# Patient Record
Sex: Female | Born: 1940 | Race: White | Hispanic: No | Marital: Married
Health system: Southern US, Community
[De-identification: ages and names within clinical notes are randomized; demographics above are authoritative.]

---

## 1997-07-26 ENCOUNTER — Ambulatory Visit (HOSPITAL_BASED_OUTPATIENT_CLINIC_OR_DEPARTMENT_OTHER): Admission: RE | Admit: 1997-07-26 | Discharge: 1997-07-26 | Payer: Self-pay | Admitting: Plastic Surgery

## 1997-10-31 ENCOUNTER — Other Ambulatory Visit: Admission: RE | Admit: 1997-10-31 | Discharge: 1997-10-31 | Payer: Self-pay | Admitting: *Deleted

## 1997-11-01 ENCOUNTER — Other Ambulatory Visit: Admission: RE | Admit: 1997-11-01 | Discharge: 1997-11-01 | Payer: Self-pay | Admitting: *Deleted

## 1998-11-03 ENCOUNTER — Other Ambulatory Visit: Admission: RE | Admit: 1998-11-03 | Discharge: 1998-11-03 | Payer: Self-pay | Admitting: *Deleted

## 1999-05-04 ENCOUNTER — Encounter: Admission: RE | Admit: 1999-05-04 | Discharge: 1999-05-04 | Payer: Self-pay | Admitting: Geriatric Medicine

## 1999-05-04 ENCOUNTER — Encounter: Payer: Self-pay | Admitting: Geriatric Medicine

## 1999-09-28 ENCOUNTER — Encounter: Admission: RE | Admit: 1999-09-28 | Discharge: 1999-09-28 | Payer: Self-pay | Admitting: Geriatric Medicine

## 1999-09-28 ENCOUNTER — Encounter: Payer: Self-pay | Admitting: Geriatric Medicine

## 1999-11-20 ENCOUNTER — Other Ambulatory Visit: Admission: RE | Admit: 1999-11-20 | Discharge: 1999-11-20 | Payer: Self-pay | Admitting: *Deleted

## 2000-09-29 ENCOUNTER — Encounter: Admission: RE | Admit: 2000-09-29 | Discharge: 2000-09-29 | Payer: Self-pay | Admitting: *Deleted

## 2000-09-29 ENCOUNTER — Encounter: Payer: Self-pay | Admitting: *Deleted

## 2000-11-25 ENCOUNTER — Other Ambulatory Visit: Admission: RE | Admit: 2000-11-25 | Discharge: 2000-11-25 | Payer: Self-pay | Admitting: *Deleted

## 2000-12-15 ENCOUNTER — Encounter: Admission: RE | Admit: 2000-12-15 | Discharge: 2000-12-15 | Payer: Self-pay | Admitting: *Deleted

## 2000-12-15 ENCOUNTER — Encounter: Payer: Self-pay | Admitting: *Deleted

## 2001-09-29 ENCOUNTER — Encounter: Payer: Self-pay | Admitting: Geriatric Medicine

## 2001-09-29 ENCOUNTER — Encounter: Admission: RE | Admit: 2001-09-29 | Discharge: 2001-09-29 | Payer: Self-pay | Admitting: Geriatric Medicine

## 2001-11-09 ENCOUNTER — Other Ambulatory Visit: Admission: RE | Admit: 2001-11-09 | Discharge: 2001-11-09 | Payer: Self-pay | Admitting: Obstetrics and Gynecology

## 2002-10-01 ENCOUNTER — Encounter: Admission: RE | Admit: 2002-10-01 | Discharge: 2002-10-01 | Payer: Self-pay | Admitting: Obstetrics and Gynecology

## 2002-10-01 ENCOUNTER — Encounter: Payer: Self-pay | Admitting: Obstetrics and Gynecology

## 2002-11-15 ENCOUNTER — Other Ambulatory Visit: Admission: RE | Admit: 2002-11-15 | Discharge: 2002-11-15 | Payer: Self-pay | Admitting: Obstetrics and Gynecology

## 2003-02-16 ENCOUNTER — Ambulatory Visit (HOSPITAL_COMMUNITY): Admission: RE | Admit: 2003-02-16 | Discharge: 2003-02-16 | Payer: Self-pay | Admitting: Gastroenterology

## 2003-02-16 ENCOUNTER — Encounter (INDEPENDENT_AMBULATORY_CARE_PROVIDER_SITE_OTHER): Payer: Self-pay | Admitting: Specialist

## 2003-10-03 ENCOUNTER — Encounter: Admission: RE | Admit: 2003-10-03 | Discharge: 2003-10-03 | Payer: Self-pay | Admitting: Geriatric Medicine

## 2003-12-12 ENCOUNTER — Encounter: Admission: RE | Admit: 2003-12-12 | Discharge: 2003-12-12 | Payer: Self-pay | Admitting: Obstetrics and Gynecology

## 2004-10-03 ENCOUNTER — Encounter: Admission: RE | Admit: 2004-10-03 | Discharge: 2004-10-03 | Payer: Self-pay | Admitting: Obstetrics and Gynecology

## 2005-10-07 ENCOUNTER — Encounter: Admission: RE | Admit: 2005-10-07 | Discharge: 2005-10-07 | Payer: Self-pay | Admitting: Geriatric Medicine

## 2006-10-16 ENCOUNTER — Encounter: Admission: RE | Admit: 2006-10-16 | Discharge: 2006-10-16 | Payer: Self-pay | Admitting: Obstetrics and Gynecology

## 2007-10-19 ENCOUNTER — Encounter: Admission: RE | Admit: 2007-10-19 | Discharge: 2007-10-19 | Payer: Self-pay | Admitting: Geriatric Medicine

## 2008-10-19 ENCOUNTER — Encounter: Admission: RE | Admit: 2008-10-19 | Discharge: 2008-10-19 | Payer: Self-pay | Admitting: Geriatric Medicine

## 2009-10-20 ENCOUNTER — Encounter: Admission: RE | Admit: 2009-10-20 | Discharge: 2009-10-20 | Payer: Self-pay | Admitting: Geriatric Medicine

## 2010-06-25 ENCOUNTER — Other Ambulatory Visit: Payer: Self-pay | Admitting: Dermatology

## 2010-07-20 NOTE — Op Note (Signed)
Judith Willis, Judith Willis                          ACCOUNT NO.:  [id number]   MEDICAL RECORD NO.:  [id number]                   PATIENT TYPE:  AMB   LOCATION:  ENDO                                 FACILITY:  [facility]   PHYSICIAN:  Danise Edge, M.D.                DATE OF BIRTH:  29-Oct-1940   DATE OF PROCEDURE:  02/16/2003  DATE OF DISCHARGE:                                 OPERATIVE REPORT   PROCEDURE:  Screening colonoscopy.   INDICATIONS FOR PROCEDURE:  [name] is a 70 year old female born  1940/10/26.  [name] is scheduled to undergo her first screening  colonoscopy with polypectomy to prevent colon cancer.  She has new onset  constipation.   ENDOSCOPIST:  Danise Edge, M.D.   PREMEDICATION:  Versed 5 mg, Demerol 50 mg.   DESCRIPTION OF PROCEDURE:  After obtaining informed consent, [name] was  placed in the left lateral decubitus position. I administered intravenous  Demerol and intravenous Versed to achieve conscious sedation for the  procedure. The patient's blood pressure, oxygen saturation and cardiac  rhythm were monitored throughout the procedure and documented in the medical  record.   Anal inspection was normal. Digital rectal exam was normal. The Olympus  adjustable pediatric colonoscope was introduced into the rectum and advanced  to the cecum. Colonic preparation for the exam today was excellent.   RECTUM:  From the mid rectum, two diminutive polyps were removed with the  cold biopsy forceps and submitted for pathological interpretation.   SIGMOID COLON AND DESCENDING COLON:  Normal.   SPLENIC FLEXURE:  Normal.   TRANSVERSE COLON:  Normal.   HEPATIC FLEXURE:  Normal.   ASCENDING COLON:  Normal.   CECUM AND ILEOCECAL VALVE:  Normal.   ASSESSMENT:  Two diminutive rectal polyps were removed;  otherwise normal  proctocolonoscopy to the cecum.   RECOMMENDATIONS:  Repeat colonoscopy in five years if rectal polyps return  neoplastic  pathologically.                                               Danise Edge, M.D.    MJ/MEDQ  D:  02/16/2003  T:  02/16/2003  Job:  [id number]   cc:   Hal T. Stoneking, M.D.  [address]  [place], [place] [address]  Fax: [phone]   Cordelia Pen A. Rosalio Macadamia, M.D.  [address]  [address]  [place]  [place] [address]  Fax: [phone]

## 2010-08-28 ENCOUNTER — Other Ambulatory Visit: Payer: Self-pay | Admitting: Obstetrics

## 2010-08-28 DIAGNOSIS — Z1231 Encounter for screening mammogram for malignant neoplasm of breast: Secondary | ICD-10-CM

## 2010-10-23 ENCOUNTER — Ambulatory Visit
Admission: RE | Admit: 2010-10-23 | Discharge: 2010-10-23 | Disposition: A | Discharge status: Home / Self Care | Payer: Medicare Other | Source: Ambulatory Visit | Attending: Obstetrics | Admitting: Obstetrics

## 2010-10-23 DIAGNOSIS — Z1231 Encounter for screening mammogram for malignant neoplasm of breast: Secondary | ICD-10-CM

## 2011-09-18 ENCOUNTER — Other Ambulatory Visit: Payer: Self-pay | Admitting: Geriatric Medicine

## 2011-09-18 DIAGNOSIS — Z1231 Encounter for screening mammogram for malignant neoplasm of breast: Secondary | ICD-10-CM

## 2011-11-01 ENCOUNTER — Ambulatory Visit
Admission: RE | Admit: 2011-11-01 | Discharge: 2011-11-01 | Disposition: A | Discharge status: Home / Self Care | Payer: Medicare Other | Source: Ambulatory Visit | Attending: Geriatric Medicine | Admitting: Geriatric Medicine

## 2011-11-01 DIAGNOSIS — Z1231 Encounter for screening mammogram for malignant neoplasm of breast: Secondary | ICD-10-CM

## 2012-03-12 ENCOUNTER — Other Ambulatory Visit: Payer: Self-pay | Admitting: Obstetrics

## 2012-03-12 DIAGNOSIS — M899 Disorder of bone, unspecified: Secondary | ICD-10-CM

## 2012-03-30 ENCOUNTER — Other Ambulatory Visit: Payer: Self-pay | Admitting: Obstetrics

## 2012-03-30 ENCOUNTER — Ambulatory Visit
Admission: RE | Admit: 2012-03-30 | Discharge: 2012-03-30 | Disposition: A | Discharge status: Home / Self Care | Payer: Medicare Other | Source: Ambulatory Visit | Attending: Obstetrics | Admitting: Obstetrics

## 2012-03-30 DIAGNOSIS — M899 Disorder of bone, unspecified: Secondary | ICD-10-CM

## 2012-03-30 DIAGNOSIS — M949 Disorder of cartilage, unspecified: Secondary | ICD-10-CM

## 2012-05-04 ENCOUNTER — Ambulatory Visit
Admission: RE | Admit: 2012-05-04 | Discharge: 2012-05-04 | Disposition: A | Discharge status: Home / Self Care | Payer: Medicare Other | Source: Ambulatory Visit | Attending: Obstetrics | Admitting: Obstetrics

## 2012-05-04 DIAGNOSIS — M899 Disorder of bone, unspecified: Secondary | ICD-10-CM

## 2012-07-29 ENCOUNTER — Other Ambulatory Visit: Payer: Self-pay | Admitting: Dermatology

## 2012-10-07 ENCOUNTER — Other Ambulatory Visit: Payer: Self-pay

## 2012-10-07 DIAGNOSIS — Z1231 Encounter for screening mammogram for malignant neoplasm of breast: Secondary | ICD-10-CM

## 2012-11-03 ENCOUNTER — Ambulatory Visit
Admission: RE | Admit: 2012-11-03 | Discharge: 2012-11-03 | Disposition: A | Discharge status: Home / Self Care | Payer: Medicare Other | Source: Ambulatory Visit

## 2012-11-03 DIAGNOSIS — Z1231 Encounter for screening mammogram for malignant neoplasm of breast: Secondary | ICD-10-CM

## 2013-03-03 ENCOUNTER — Other Ambulatory Visit: Payer: Self-pay | Admitting: Dermatology

## 2013-08-02 ENCOUNTER — Other Ambulatory Visit: Payer: Self-pay | Admitting: Dermatology

## 2013-10-06 ENCOUNTER — Other Ambulatory Visit: Payer: Self-pay

## 2013-10-06 DIAGNOSIS — Z1231 Encounter for screening mammogram for malignant neoplasm of breast: Secondary | ICD-10-CM

## 2013-11-05 ENCOUNTER — Ambulatory Visit
Admission: RE | Admit: 2013-11-05 | Discharge: 2013-11-05 | Disposition: A | Discharge status: Home / Self Care | Payer: Medicare Other | Source: Ambulatory Visit

## 2013-11-05 DIAGNOSIS — Z1231 Encounter for screening mammogram for malignant neoplasm of breast: Secondary | ICD-10-CM

## 2014-01-13 ENCOUNTER — Other Ambulatory Visit: Payer: Self-pay | Admitting: Dermatology

## 2014-03-02 ENCOUNTER — Other Ambulatory Visit: Payer: Self-pay | Admitting: Dermatology

## 2014-05-04 ENCOUNTER — Other Ambulatory Visit: Payer: Self-pay | Admitting: Dermatology

## 2014-10-07 ENCOUNTER — Other Ambulatory Visit: Payer: Self-pay

## 2014-10-07 DIAGNOSIS — Z1231 Encounter for screening mammogram for malignant neoplasm of breast: Secondary | ICD-10-CM

## 2014-11-15 ENCOUNTER — Ambulatory Visit
Admission: RE | Admit: 2014-11-15 | Discharge: 2014-11-15 | Disposition: A | Discharge status: Home / Self Care | Payer: Medicare Other | Source: Ambulatory Visit

## 2014-11-15 DIAGNOSIS — Z1231 Encounter for screening mammogram for malignant neoplasm of breast: Secondary | ICD-10-CM

## 2015-04-17 ENCOUNTER — Other Ambulatory Visit: Payer: Self-pay | Admitting: Obstetrics

## 2015-04-17 DIAGNOSIS — E2839 Other primary ovarian failure: Secondary | ICD-10-CM

## 2015-04-17 DIAGNOSIS — M949 Disorder of cartilage, unspecified: Principal | ICD-10-CM

## 2015-04-17 DIAGNOSIS — M899 Disorder of bone, unspecified: Secondary | ICD-10-CM

## 2015-04-28 ENCOUNTER — Ambulatory Visit
Admission: RE | Admit: 2015-04-28 | Discharge: 2015-04-28 | Disposition: A | Discharge status: Home / Self Care | Payer: Medicare Other | Source: Ambulatory Visit | Attending: Obstetrics | Admitting: Obstetrics

## 2015-04-28 DIAGNOSIS — M949 Disorder of cartilage, unspecified: Principal | ICD-10-CM

## 2015-04-28 DIAGNOSIS — E2839 Other primary ovarian failure: Secondary | ICD-10-CM

## 2015-04-28 DIAGNOSIS — M899 Disorder of bone, unspecified: Secondary | ICD-10-CM

## 2015-10-09 ENCOUNTER — Other Ambulatory Visit: Payer: Self-pay | Admitting: Geriatric Medicine

## 2015-10-09 DIAGNOSIS — Z1231 Encounter for screening mammogram for malignant neoplasm of breast: Secondary | ICD-10-CM

## 2015-11-20 ENCOUNTER — Ambulatory Visit
Admission: RE | Admit: 2015-11-20 | Discharge: 2015-11-20 | Disposition: A | Discharge status: Home / Self Care | Payer: Medicare Other | Source: Ambulatory Visit | Attending: Geriatric Medicine | Admitting: Geriatric Medicine

## 2015-11-20 DIAGNOSIS — Z1231 Encounter for screening mammogram for malignant neoplasm of breast: Secondary | ICD-10-CM

## 2016-10-11 ENCOUNTER — Other Ambulatory Visit: Payer: Self-pay | Admitting: Geriatric Medicine

## 2016-10-11 DIAGNOSIS — Z1231 Encounter for screening mammogram for malignant neoplasm of breast: Secondary | ICD-10-CM

## 2016-11-25 ENCOUNTER — Ambulatory Visit
Admission: RE | Admit: 2016-11-25 | Discharge: 2016-11-25 | Disposition: A | Discharge status: Home / Self Care | Payer: Medicare Other | Source: Ambulatory Visit | Attending: Geriatric Medicine | Admitting: Geriatric Medicine

## 2016-11-25 DIAGNOSIS — Z1231 Encounter for screening mammogram for malignant neoplasm of breast: Secondary | ICD-10-CM

## 2017-10-20 ENCOUNTER — Other Ambulatory Visit: Payer: Self-pay | Admitting: Geriatric Medicine

## 2017-10-20 DIAGNOSIS — Z1231 Encounter for screening mammogram for malignant neoplasm of breast: Secondary | ICD-10-CM

## 2017-11-28 ENCOUNTER — Ambulatory Visit
Admission: RE | Admit: 2017-11-28 | Discharge: 2017-11-28 | Disposition: A | Discharge status: Home / Self Care | Payer: Medicare Other | Source: Ambulatory Visit | Attending: Geriatric Medicine | Admitting: Geriatric Medicine

## 2017-11-28 DIAGNOSIS — Z1231 Encounter for screening mammogram for malignant neoplasm of breast: Secondary | ICD-10-CM

## 2018-10-21 ENCOUNTER — Other Ambulatory Visit: Payer: Self-pay | Admitting: Geriatric Medicine

## 2018-10-21 DIAGNOSIS — Z1231 Encounter for screening mammogram for malignant neoplasm of breast: Secondary | ICD-10-CM

## 2018-11-18 ENCOUNTER — Other Ambulatory Visit: Payer: Self-pay | Admitting: Geriatric Medicine

## 2018-11-18 DIAGNOSIS — M8580 Other specified disorders of bone density and structure, unspecified site: Secondary | ICD-10-CM

## 2018-12-04 ENCOUNTER — Other Ambulatory Visit: Payer: Self-pay

## 2018-12-04 ENCOUNTER — Ambulatory Visit
Admission: RE | Admit: 2018-12-04 | Discharge: 2018-12-04 | Disposition: A | Discharge status: Home / Self Care | Payer: Medicare Other | Source: Ambulatory Visit | Attending: Geriatric Medicine | Admitting: Geriatric Medicine

## 2018-12-04 DIAGNOSIS — Z1231 Encounter for screening mammogram for malignant neoplasm of breast: Secondary | ICD-10-CM

## 2019-01-13 ENCOUNTER — Other Ambulatory Visit: Payer: Medicare Other

## 2019-03-16 ENCOUNTER — Other Ambulatory Visit: Payer: Medicare Other

## 2019-03-22 ENCOUNTER — Ambulatory Visit: Payer: Medicare Other

## 2019-03-23 ENCOUNTER — Ambulatory Visit: Payer: Medicare PPO | Attending: Internal Medicine

## 2019-03-23 DIAGNOSIS — Z23 Encounter for immunization: Secondary | ICD-10-CM | POA: Insufficient documentation

## 2019-03-23 NOTE — Progress Notes (Signed)
   Covid-19 Vaccination Clinic  Name:  SHAKILA MAK    MRN: [id number] DOB: Mar 29, 1940  03/23/2019  Ms. Beil was observed post Covid-19 immunization for 15 minutes without incidence. She was provided with Vaccine Information Sheet and instruction to access the V-Safe system.   Ms. Balash was instructed to call 911 with any severe reactions post vaccine: Marland Kitchen Difficulty breathing  . Swelling of your face and throat  . A fast heartbeat  . A bad rash all over your body  . Dizziness and weakness    Immunizations Administered    Name Date Dose VIS Date Route   Pfizer COVID-19 Vaccine 03/23/2019  1:54 PM 0.3 mL 02/12/2019 Intramuscular   Manufacturer: [employer], [employer]   Lot: [id number]   NDC: [address]-4

## 2019-04-13 ENCOUNTER — Ambulatory Visit: Payer: Medicare PPO

## 2019-04-13 ENCOUNTER — Ambulatory Visit: Payer: Medicare PPO | Attending: Internal Medicine

## 2019-04-13 DIAGNOSIS — Z23 Encounter for immunization: Secondary | ICD-10-CM | POA: Insufficient documentation

## 2019-04-13 NOTE — Progress Notes (Signed)
   Covid-19 Vaccination Clinic  Name:  Judith Willis    MRN: [id number] DOB: 1940/11/30  04/13/2019  [name] was observed post Covid-19 immunization for 15 minutes without incidence. She was provided with Vaccine Information Sheet and instruction to access the V-Safe system.   [name] was instructed to call 911 with any severe reactions post vaccine: Marland Kitchen Difficulty breathing  . Swelling of your face and throat  . A fast heartbeat  . A bad rash all over your body  . Dizziness and weakness    Immunizations Administered    Name Date Dose VIS Date Route   Pfizer COVID-19 Vaccine 04/13/2019  8:24 AM 0.3 mL 02/12/2019 Intramuscular   Manufacturer: [employer], [employer]   Lot: [id number]   NDC: [address]-2

## 2019-04-15 ENCOUNTER — Other Ambulatory Visit: Payer: Medicare Other

## 2019-05-12 ENCOUNTER — Other Ambulatory Visit: Payer: Self-pay

## 2019-05-12 ENCOUNTER — Ambulatory Visit
Admission: RE | Admit: 2019-05-12 | Discharge: 2019-05-12 | Disposition: A | Discharge status: Home / Self Care | Payer: Medicare PPO | Source: Ambulatory Visit | Attending: Geriatric Medicine | Admitting: Geriatric Medicine

## 2019-05-12 DIAGNOSIS — M8580 Other specified disorders of bone density and structure, unspecified site: Secondary | ICD-10-CM

## 2019-07-07 DIAGNOSIS — H353211 Exudative age-related macular degeneration, right eye, with active choroidal neovascularization: Secondary | ICD-10-CM | POA: Diagnosis not present

## 2019-07-07 DIAGNOSIS — Z961 Presence of intraocular lens: Secondary | ICD-10-CM | POA: Diagnosis not present

## 2019-07-07 DIAGNOSIS — H353122 Nonexudative age-related macular degeneration, left eye, intermediate dry stage: Secondary | ICD-10-CM | POA: Diagnosis not present

## 2019-07-07 DIAGNOSIS — H40003 Preglaucoma, unspecified, bilateral: Secondary | ICD-10-CM | POA: Diagnosis not present

## 2019-08-04 DIAGNOSIS — D485 Neoplasm of uncertain behavior of skin: Secondary | ICD-10-CM | POA: Diagnosis not present

## 2019-08-04 DIAGNOSIS — D0472 Carcinoma in situ of skin of left lower limb, including hip: Secondary | ICD-10-CM | POA: Diagnosis not present

## 2019-08-04 DIAGNOSIS — L570 Actinic keratosis: Secondary | ICD-10-CM | POA: Diagnosis not present

## 2019-08-04 DIAGNOSIS — L821 Other seborrheic keratosis: Secondary | ICD-10-CM | POA: Diagnosis not present

## 2019-08-04 DIAGNOSIS — Z85828 Personal history of other malignant neoplasm of skin: Secondary | ICD-10-CM | POA: Diagnosis not present

## 2019-08-04 DIAGNOSIS — D225 Melanocytic nevi of trunk: Secondary | ICD-10-CM | POA: Diagnosis not present

## 2019-08-04 DIAGNOSIS — L578 Other skin changes due to chronic exposure to nonionizing radiation: Secondary | ICD-10-CM | POA: Diagnosis not present

## 2019-08-10 DIAGNOSIS — H35713 Central serous chorioretinopathy, bilateral: Secondary | ICD-10-CM | POA: Diagnosis not present

## 2019-08-10 DIAGNOSIS — H353222 Exudative age-related macular degeneration, left eye, with inactive choroidal neovascularization: Secondary | ICD-10-CM | POA: Diagnosis not present

## 2019-08-10 DIAGNOSIS — H353211 Exudative age-related macular degeneration, right eye, with active choroidal neovascularization: Secondary | ICD-10-CM | POA: Diagnosis not present

## 2019-08-10 DIAGNOSIS — H40003 Preglaucoma, unspecified, bilateral: Secondary | ICD-10-CM | POA: Diagnosis not present

## 2019-08-10 DIAGNOSIS — Z961 Presence of intraocular lens: Secondary | ICD-10-CM | POA: Diagnosis not present

## 2019-09-28 DIAGNOSIS — D099 Carcinoma in situ, unspecified: Secondary | ICD-10-CM | POA: Diagnosis not present

## 2019-10-13 DIAGNOSIS — H353222 Exudative age-related macular degeneration, left eye, with inactive choroidal neovascularization: Secondary | ICD-10-CM | POA: Diagnosis not present

## 2019-10-13 DIAGNOSIS — Z961 Presence of intraocular lens: Secondary | ICD-10-CM | POA: Diagnosis not present

## 2019-10-13 DIAGNOSIS — H353211 Exudative age-related macular degeneration, right eye, with active choroidal neovascularization: Secondary | ICD-10-CM | POA: Diagnosis not present

## 2019-11-22 ENCOUNTER — Other Ambulatory Visit: Payer: Self-pay | Admitting: Geriatric Medicine

## 2019-11-22 DIAGNOSIS — Z1231 Encounter for screening mammogram for malignant neoplasm of breast: Secondary | ICD-10-CM

## 2019-11-22 DIAGNOSIS — I10 Essential (primary) hypertension: Secondary | ICD-10-CM | POA: Diagnosis not present

## 2019-11-22 DIAGNOSIS — Z0000 Encounter for general adult medical examination without abnormal findings: Secondary | ICD-10-CM | POA: Diagnosis not present

## 2019-11-22 DIAGNOSIS — Z23 Encounter for immunization: Secondary | ICD-10-CM | POA: Diagnosis not present

## 2019-11-22 DIAGNOSIS — Z79899 Other long term (current) drug therapy: Secondary | ICD-10-CM | POA: Diagnosis not present

## 2019-11-22 DIAGNOSIS — Z1389 Encounter for screening for other disorder: Secondary | ICD-10-CM | POA: Diagnosis not present

## 2019-12-08 DIAGNOSIS — Z961 Presence of intraocular lens: Secondary | ICD-10-CM | POA: Diagnosis not present

## 2019-12-08 DIAGNOSIS — H35713 Central serous chorioretinopathy, bilateral: Secondary | ICD-10-CM | POA: Diagnosis not present

## 2019-12-08 DIAGNOSIS — H40003 Preglaucoma, unspecified, bilateral: Secondary | ICD-10-CM | POA: Diagnosis not present

## 2019-12-08 DIAGNOSIS — H353222 Exudative age-related macular degeneration, left eye, with inactive choroidal neovascularization: Secondary | ICD-10-CM | POA: Diagnosis not present

## 2019-12-08 DIAGNOSIS — H353211 Exudative age-related macular degeneration, right eye, with active choroidal neovascularization: Secondary | ICD-10-CM | POA: Diagnosis not present

## 2019-12-10 ENCOUNTER — Other Ambulatory Visit: Payer: Self-pay | Admitting: Geriatric Medicine

## 2019-12-10 ENCOUNTER — Other Ambulatory Visit: Payer: Self-pay

## 2019-12-10 ENCOUNTER — Ambulatory Visit
Admission: RE | Admit: 2019-12-10 | Discharge: 2019-12-10 | Disposition: A | Discharge status: Home / Self Care | Payer: Medicare PPO | Source: Ambulatory Visit | Attending: Geriatric Medicine | Admitting: Geriatric Medicine

## 2019-12-10 DIAGNOSIS — Z1231 Encounter for screening mammogram for malignant neoplasm of breast: Secondary | ICD-10-CM

## 2020-01-17 ENCOUNTER — Ambulatory Visit
Admission: RE | Admit: 2020-01-17 | Discharge: 2020-01-17 | Disposition: A | Discharge status: Home / Self Care | Payer: Medicare PPO | Source: Ambulatory Visit | Attending: Geriatric Medicine | Admitting: Geriatric Medicine

## 2020-01-17 ENCOUNTER — Other Ambulatory Visit: Payer: Self-pay

## 2020-01-17 DIAGNOSIS — Z1231 Encounter for screening mammogram for malignant neoplasm of breast: Secondary | ICD-10-CM

## 2020-02-03 DIAGNOSIS — H353211 Exudative age-related macular degeneration, right eye, with active choroidal neovascularization: Secondary | ICD-10-CM | POA: Diagnosis not present

## 2020-02-03 DIAGNOSIS — H35713 Central serous chorioretinopathy, bilateral: Secondary | ICD-10-CM | POA: Diagnosis not present

## 2020-02-03 DIAGNOSIS — H353222 Exudative age-related macular degeneration, left eye, with inactive choroidal neovascularization: Secondary | ICD-10-CM | POA: Diagnosis not present

## 2020-02-03 DIAGNOSIS — H40003 Preglaucoma, unspecified, bilateral: Secondary | ICD-10-CM | POA: Diagnosis not present

## 2020-02-03 DIAGNOSIS — Z961 Presence of intraocular lens: Secondary | ICD-10-CM | POA: Diagnosis not present

## 2020-03-08 DIAGNOSIS — L821 Other seborrheic keratosis: Secondary | ICD-10-CM | POA: Diagnosis not present

## 2020-03-08 DIAGNOSIS — Z85828 Personal history of other malignant neoplasm of skin: Secondary | ICD-10-CM | POA: Diagnosis not present

## 2020-03-08 DIAGNOSIS — L570 Actinic keratosis: Secondary | ICD-10-CM | POA: Diagnosis not present

## 2020-03-08 DIAGNOSIS — L739 Follicular disorder, unspecified: Secondary | ICD-10-CM | POA: Diagnosis not present

## 2020-03-08 DIAGNOSIS — D485 Neoplasm of uncertain behavior of skin: Secondary | ICD-10-CM | POA: Diagnosis not present

## 2020-03-08 DIAGNOSIS — D225 Melanocytic nevi of trunk: Secondary | ICD-10-CM | POA: Diagnosis not present

## 2020-03-08 DIAGNOSIS — L578 Other skin changes due to chronic exposure to nonionizing radiation: Secondary | ICD-10-CM | POA: Diagnosis not present

## 2020-03-31 DIAGNOSIS — H353211 Exudative age-related macular degeneration, right eye, with active choroidal neovascularization: Secondary | ICD-10-CM | POA: Diagnosis not present

## 2020-03-31 DIAGNOSIS — H40003 Preglaucoma, unspecified, bilateral: Secondary | ICD-10-CM | POA: Diagnosis not present

## 2020-03-31 DIAGNOSIS — Z961 Presence of intraocular lens: Secondary | ICD-10-CM | POA: Diagnosis not present

## 2020-03-31 DIAGNOSIS — H35713 Central serous chorioretinopathy, bilateral: Secondary | ICD-10-CM | POA: Diagnosis not present

## 2020-03-31 DIAGNOSIS — H353222 Exudative age-related macular degeneration, left eye, with inactive choroidal neovascularization: Secondary | ICD-10-CM | POA: Diagnosis not present

## 2020-05-08 DIAGNOSIS — K219 Gastro-esophageal reflux disease without esophagitis: Secondary | ICD-10-CM | POA: Diagnosis not present

## 2020-05-08 DIAGNOSIS — I10 Essential (primary) hypertension: Secondary | ICD-10-CM | POA: Diagnosis not present

## 2020-05-08 DIAGNOSIS — M545 Low back pain, unspecified: Secondary | ICD-10-CM | POA: Diagnosis not present

## 2020-06-26 DIAGNOSIS — H353222 Exudative age-related macular degeneration, left eye, with inactive choroidal neovascularization: Secondary | ICD-10-CM | POA: Diagnosis not present

## 2020-06-26 DIAGNOSIS — H52203 Unspecified astigmatism, bilateral: Secondary | ICD-10-CM | POA: Diagnosis not present

## 2020-06-26 DIAGNOSIS — H40013 Open angle with borderline findings, low risk, bilateral: Secondary | ICD-10-CM | POA: Diagnosis not present

## 2020-06-26 DIAGNOSIS — Z961 Presence of intraocular lens: Secondary | ICD-10-CM | POA: Diagnosis not present

## 2020-07-10 DIAGNOSIS — I10 Essential (primary) hypertension: Secondary | ICD-10-CM | POA: Diagnosis not present

## 2020-07-26 DIAGNOSIS — Z124 Encounter for screening for malignant neoplasm of cervix: Secondary | ICD-10-CM | POA: Diagnosis not present

## 2020-07-26 DIAGNOSIS — Z01419 Encounter for gynecological examination (general) (routine) without abnormal findings: Secondary | ICD-10-CM | POA: Diagnosis not present

## 2020-07-26 DIAGNOSIS — Z6824 Body mass index (BMI) 24.0-24.9, adult: Secondary | ICD-10-CM | POA: Diagnosis not present

## 2020-07-26 DIAGNOSIS — Z01411 Encounter for gynecological examination (general) (routine) with abnormal findings: Secondary | ICD-10-CM | POA: Diagnosis not present

## 2020-08-02 DIAGNOSIS — Z961 Presence of intraocular lens: Secondary | ICD-10-CM | POA: Diagnosis not present

## 2020-08-02 DIAGNOSIS — H40003 Preglaucoma, unspecified, bilateral: Secondary | ICD-10-CM | POA: Diagnosis not present

## 2020-08-02 DIAGNOSIS — H353132 Nonexudative age-related macular degeneration, bilateral, intermediate dry stage: Secondary | ICD-10-CM | POA: Diagnosis not present

## 2020-09-06 DIAGNOSIS — H40013 Open angle with borderline findings, low risk, bilateral: Secondary | ICD-10-CM | POA: Diagnosis not present

## 2020-09-19 DIAGNOSIS — H353132 Nonexudative age-related macular degeneration, bilateral, intermediate dry stage: Secondary | ICD-10-CM | POA: Diagnosis not present

## 2020-09-21 DIAGNOSIS — L821 Other seborrheic keratosis: Secondary | ICD-10-CM | POA: Diagnosis not present

## 2020-09-21 DIAGNOSIS — L578 Other skin changes due to chronic exposure to nonionizing radiation: Secondary | ICD-10-CM | POA: Diagnosis not present

## 2020-09-21 DIAGNOSIS — L0291 Cutaneous abscess, unspecified: Secondary | ICD-10-CM | POA: Diagnosis not present

## 2020-09-21 DIAGNOSIS — D225 Melanocytic nevi of trunk: Secondary | ICD-10-CM | POA: Diagnosis not present

## 2020-09-21 DIAGNOSIS — L089 Local infection of the skin and subcutaneous tissue, unspecified: Secondary | ICD-10-CM | POA: Diagnosis not present

## 2020-09-21 DIAGNOSIS — L723 Sebaceous cyst: Secondary | ICD-10-CM | POA: Diagnosis not present

## 2020-09-21 DIAGNOSIS — Z85828 Personal history of other malignant neoplasm of skin: Secondary | ICD-10-CM | POA: Diagnosis not present

## 2020-09-21 DIAGNOSIS — L570 Actinic keratosis: Secondary | ICD-10-CM | POA: Diagnosis not present

## 2020-10-19 DIAGNOSIS — H353132 Nonexudative age-related macular degeneration, bilateral, intermediate dry stage: Secondary | ICD-10-CM | POA: Diagnosis not present

## 2020-11-18 DIAGNOSIS — H353132 Nonexudative age-related macular degeneration, bilateral, intermediate dry stage: Secondary | ICD-10-CM | POA: Diagnosis not present

## 2020-11-23 DIAGNOSIS — K219 Gastro-esophageal reflux disease without esophagitis: Secondary | ICD-10-CM | POA: Diagnosis not present

## 2020-11-23 DIAGNOSIS — Z23 Encounter for immunization: Secondary | ICD-10-CM | POA: Diagnosis not present

## 2020-11-23 DIAGNOSIS — Z1389 Encounter for screening for other disorder: Secondary | ICD-10-CM | POA: Diagnosis not present

## 2020-11-23 DIAGNOSIS — I10 Essential (primary) hypertension: Secondary | ICD-10-CM | POA: Diagnosis not present

## 2020-11-23 DIAGNOSIS — Z79899 Other long term (current) drug therapy: Secondary | ICD-10-CM | POA: Diagnosis not present

## 2020-11-23 DIAGNOSIS — Z0000 Encounter for general adult medical examination without abnormal findings: Secondary | ICD-10-CM | POA: Diagnosis not present

## 2020-12-07 DIAGNOSIS — J029 Acute pharyngitis, unspecified: Secondary | ICD-10-CM | POA: Diagnosis not present

## 2020-12-07 DIAGNOSIS — J069 Acute upper respiratory infection, unspecified: Secondary | ICD-10-CM | POA: Diagnosis not present

## 2020-12-08 ENCOUNTER — Other Ambulatory Visit: Payer: Self-pay | Admitting: Geriatric Medicine

## 2020-12-08 DIAGNOSIS — Z1231 Encounter for screening mammogram for malignant neoplasm of breast: Secondary | ICD-10-CM

## 2020-12-18 DIAGNOSIS — H353132 Nonexudative age-related macular degeneration, bilateral, intermediate dry stage: Secondary | ICD-10-CM | POA: Diagnosis not present

## 2020-12-21 DIAGNOSIS — L570 Actinic keratosis: Secondary | ICD-10-CM | POA: Diagnosis not present

## 2020-12-21 DIAGNOSIS — D485 Neoplasm of uncertain behavior of skin: Secondary | ICD-10-CM | POA: Diagnosis not present

## 2020-12-22 DIAGNOSIS — H353132 Nonexudative age-related macular degeneration, bilateral, intermediate dry stage: Secondary | ICD-10-CM | POA: Diagnosis not present

## 2020-12-22 DIAGNOSIS — H40003 Preglaucoma, unspecified, bilateral: Secondary | ICD-10-CM | POA: Diagnosis not present

## 2020-12-22 DIAGNOSIS — Z961 Presence of intraocular lens: Secondary | ICD-10-CM | POA: Diagnosis not present

## 2020-12-22 DIAGNOSIS — H35713 Central serous chorioretinopathy, bilateral: Secondary | ICD-10-CM | POA: Diagnosis not present

## 2021-01-17 DIAGNOSIS — H353132 Nonexudative age-related macular degeneration, bilateral, intermediate dry stage: Secondary | ICD-10-CM | POA: Diagnosis not present

## 2021-01-18 ENCOUNTER — Other Ambulatory Visit: Payer: Self-pay

## 2021-01-18 ENCOUNTER — Ambulatory Visit
Admission: RE | Admit: 2021-01-18 | Discharge: 2021-01-18 | Disposition: A | Discharge status: Home / Self Care | Payer: Medicare PPO | Source: Ambulatory Visit | Attending: Geriatric Medicine | Admitting: Geriatric Medicine

## 2021-01-18 DIAGNOSIS — Z1231 Encounter for screening mammogram for malignant neoplasm of breast: Secondary | ICD-10-CM

## 2021-02-15 DIAGNOSIS — D485 Neoplasm of uncertain behavior of skin: Secondary | ICD-10-CM | POA: Diagnosis not present

## 2021-02-15 DIAGNOSIS — D1801 Hemangioma of skin and subcutaneous tissue: Secondary | ICD-10-CM | POA: Diagnosis not present

## 2021-02-16 DIAGNOSIS — H353132 Nonexudative age-related macular degeneration, bilateral, intermediate dry stage: Secondary | ICD-10-CM | POA: Diagnosis not present

## 2021-03-15 DIAGNOSIS — Z23 Encounter for immunization: Secondary | ICD-10-CM | POA: Diagnosis not present

## 2021-03-15 DIAGNOSIS — L821 Other seborrheic keratosis: Secondary | ICD-10-CM | POA: Diagnosis not present

## 2021-03-15 DIAGNOSIS — L723 Sebaceous cyst: Secondary | ICD-10-CM | POA: Diagnosis not present

## 2021-03-15 DIAGNOSIS — Z85828 Personal history of other malignant neoplasm of skin: Secondary | ICD-10-CM | POA: Diagnosis not present

## 2021-03-15 DIAGNOSIS — D225 Melanocytic nevi of trunk: Secondary | ICD-10-CM | POA: Diagnosis not present

## 2021-03-15 DIAGNOSIS — L578 Other skin changes due to chronic exposure to nonionizing radiation: Secondary | ICD-10-CM | POA: Diagnosis not present

## 2021-03-15 DIAGNOSIS — L570 Actinic keratosis: Secondary | ICD-10-CM | POA: Diagnosis not present

## 2021-03-15 DIAGNOSIS — D485 Neoplasm of uncertain behavior of skin: Secondary | ICD-10-CM | POA: Diagnosis not present

## 2021-03-18 DIAGNOSIS — H353132 Nonexudative age-related macular degeneration, bilateral, intermediate dry stage: Secondary | ICD-10-CM | POA: Diagnosis not present

## 2021-04-17 DIAGNOSIS — H353132 Nonexudative age-related macular degeneration, bilateral, intermediate dry stage: Secondary | ICD-10-CM | POA: Diagnosis not present

## 2021-05-17 DIAGNOSIS — H353132 Nonexudative age-related macular degeneration, bilateral, intermediate dry stage: Secondary | ICD-10-CM | POA: Diagnosis not present

## 2021-05-22 DIAGNOSIS — Z79899 Other long term (current) drug therapy: Secondary | ICD-10-CM | POA: Diagnosis not present

## 2021-05-22 DIAGNOSIS — I10 Essential (primary) hypertension: Secondary | ICD-10-CM | POA: Diagnosis not present

## 2021-06-16 DIAGNOSIS — H353132 Nonexudative age-related macular degeneration, bilateral, intermediate dry stage: Secondary | ICD-10-CM | POA: Diagnosis not present

## 2021-06-21 DIAGNOSIS — H40003 Preglaucoma, unspecified, bilateral: Secondary | ICD-10-CM | POA: Diagnosis not present

## 2021-06-21 DIAGNOSIS — Z961 Presence of intraocular lens: Secondary | ICD-10-CM | POA: Diagnosis not present

## 2021-06-21 DIAGNOSIS — H353132 Nonexudative age-related macular degeneration, bilateral, intermediate dry stage: Secondary | ICD-10-CM | POA: Diagnosis not present

## 2021-06-21 DIAGNOSIS — H35713 Central serous chorioretinopathy, bilateral: Secondary | ICD-10-CM | POA: Diagnosis not present

## 2021-07-02 DIAGNOSIS — H40013 Open angle with borderline findings, low risk, bilateral: Secondary | ICD-10-CM | POA: Diagnosis not present

## 2021-07-02 DIAGNOSIS — H353132 Nonexudative age-related macular degeneration, bilateral, intermediate dry stage: Secondary | ICD-10-CM | POA: Diagnosis not present

## 2021-07-02 DIAGNOSIS — H52203 Unspecified astigmatism, bilateral: Secondary | ICD-10-CM | POA: Diagnosis not present

## 2021-07-16 DIAGNOSIS — H353132 Nonexudative age-related macular degeneration, bilateral, intermediate dry stage: Secondary | ICD-10-CM | POA: Diagnosis not present

## 2021-08-15 DIAGNOSIS — H353132 Nonexudative age-related macular degeneration, bilateral, intermediate dry stage: Secondary | ICD-10-CM | POA: Diagnosis not present

## 2021-09-14 DIAGNOSIS — H353132 Nonexudative age-related macular degeneration, bilateral, intermediate dry stage: Secondary | ICD-10-CM | POA: Diagnosis not present

## 2021-10-02 DIAGNOSIS — C44729 Squamous cell carcinoma of skin of left lower limb, including hip: Secondary | ICD-10-CM | POA: Diagnosis not present

## 2021-10-02 DIAGNOSIS — C44622 Squamous cell carcinoma of skin of right upper limb, including shoulder: Secondary | ICD-10-CM | POA: Diagnosis not present

## 2021-10-02 DIAGNOSIS — L720 Epidermal cyst: Secondary | ICD-10-CM | POA: Diagnosis not present

## 2021-10-02 DIAGNOSIS — D485 Neoplasm of uncertain behavior of skin: Secondary | ICD-10-CM | POA: Diagnosis not present

## 2021-10-02 DIAGNOSIS — L578 Other skin changes due to chronic exposure to nonionizing radiation: Secondary | ICD-10-CM | POA: Diagnosis not present

## 2021-10-02 DIAGNOSIS — L821 Other seborrheic keratosis: Secondary | ICD-10-CM | POA: Diagnosis not present

## 2021-10-02 DIAGNOSIS — L570 Actinic keratosis: Secondary | ICD-10-CM | POA: Diagnosis not present

## 2021-10-02 DIAGNOSIS — D225 Melanocytic nevi of trunk: Secondary | ICD-10-CM | POA: Diagnosis not present

## 2021-10-02 DIAGNOSIS — Z85828 Personal history of other malignant neoplasm of skin: Secondary | ICD-10-CM | POA: Diagnosis not present

## 2021-10-04 DIAGNOSIS — Z01419 Encounter for gynecological examination (general) (routine) without abnormal findings: Secondary | ICD-10-CM | POA: Diagnosis not present

## 2021-10-04 DIAGNOSIS — Z6824 Body mass index (BMI) 24.0-24.9, adult: Secondary | ICD-10-CM | POA: Diagnosis not present

## 2021-10-04 DIAGNOSIS — Z01411 Encounter for gynecological examination (general) (routine) with abnormal findings: Secondary | ICD-10-CM | POA: Diagnosis not present

## 2021-10-04 DIAGNOSIS — Z0142 Encounter for cervical smear to confirm findings of recent normal smear following initial abnormal smear: Secondary | ICD-10-CM | POA: Diagnosis not present

## 2021-10-04 DIAGNOSIS — N951 Menopausal and female climacteric states: Secondary | ICD-10-CM | POA: Diagnosis not present

## 2021-10-04 DIAGNOSIS — Z124 Encounter for screening for malignant neoplasm of cervix: Secondary | ICD-10-CM | POA: Diagnosis not present

## 2021-10-14 DIAGNOSIS — H353132 Nonexudative age-related macular degeneration, bilateral, intermediate dry stage: Secondary | ICD-10-CM | POA: Diagnosis not present

## 2021-10-25 DIAGNOSIS — C44729 Squamous cell carcinoma of skin of left lower limb, including hip: Secondary | ICD-10-CM | POA: Diagnosis not present

## 2021-11-08 DIAGNOSIS — C44729 Squamous cell carcinoma of skin of left lower limb, including hip: Secondary | ICD-10-CM | POA: Diagnosis not present

## 2021-11-08 DIAGNOSIS — Z5189 Encounter for other specified aftercare: Secondary | ICD-10-CM | POA: Diagnosis not present

## 2021-11-13 DIAGNOSIS — H353132 Nonexudative age-related macular degeneration, bilateral, intermediate dry stage: Secondary | ICD-10-CM | POA: Diagnosis not present

## 2021-11-27 DIAGNOSIS — C44622 Squamous cell carcinoma of skin of right upper limb, including shoulder: Secondary | ICD-10-CM | POA: Diagnosis not present

## 2021-11-27 DIAGNOSIS — D0461 Carcinoma in situ of skin of right upper limb, including shoulder: Secondary | ICD-10-CM | POA: Diagnosis not present

## 2021-11-28 ENCOUNTER — Other Ambulatory Visit: Payer: Self-pay | Admitting: Internal Medicine

## 2021-11-28 DIAGNOSIS — I10 Essential (primary) hypertension: Secondary | ICD-10-CM | POA: Diagnosis not present

## 2021-11-28 DIAGNOSIS — E559 Vitamin D deficiency, unspecified: Secondary | ICD-10-CM | POA: Diagnosis not present

## 2021-11-28 DIAGNOSIS — M8580 Other specified disorders of bone density and structure, unspecified site: Secondary | ICD-10-CM

## 2021-11-28 DIAGNOSIS — Z136 Encounter for screening for cardiovascular disorders: Secondary | ICD-10-CM | POA: Diagnosis not present

## 2021-11-28 DIAGNOSIS — K219 Gastro-esophageal reflux disease without esophagitis: Secondary | ICD-10-CM | POA: Diagnosis not present

## 2021-11-28 DIAGNOSIS — Z23 Encounter for immunization: Secondary | ICD-10-CM | POA: Diagnosis not present

## 2021-11-28 DIAGNOSIS — Z79899 Other long term (current) drug therapy: Secondary | ICD-10-CM | POA: Diagnosis not present

## 2021-11-28 DIAGNOSIS — Z0000 Encounter for general adult medical examination without abnormal findings: Secondary | ICD-10-CM | POA: Diagnosis not present

## 2021-11-28 DIAGNOSIS — Z1389 Encounter for screening for other disorder: Secondary | ICD-10-CM | POA: Diagnosis not present

## 2021-12-11 ENCOUNTER — Other Ambulatory Visit: Payer: Self-pay | Admitting: Internal Medicine

## 2021-12-11 DIAGNOSIS — Z1231 Encounter for screening mammogram for malignant neoplasm of breast: Secondary | ICD-10-CM

## 2021-12-13 DIAGNOSIS — H353132 Nonexudative age-related macular degeneration, bilateral, intermediate dry stage: Secondary | ICD-10-CM | POA: Diagnosis not present

## 2021-12-24 DIAGNOSIS — C44729 Squamous cell carcinoma of skin of left lower limb, including hip: Secondary | ICD-10-CM | POA: Diagnosis not present

## 2021-12-24 DIAGNOSIS — Z5189 Encounter for other specified aftercare: Secondary | ICD-10-CM | POA: Diagnosis not present

## 2022-01-03 DIAGNOSIS — H40013 Open angle with borderline findings, low risk, bilateral: Secondary | ICD-10-CM | POA: Diagnosis not present

## 2022-01-11 DIAGNOSIS — L720 Epidermal cyst: Secondary | ICD-10-CM | POA: Diagnosis not present

## 2022-01-11 DIAGNOSIS — D485 Neoplasm of uncertain behavior of skin: Secondary | ICD-10-CM | POA: Diagnosis not present

## 2022-01-11 DIAGNOSIS — D0461 Carcinoma in situ of skin of right upper limb, including shoulder: Secondary | ICD-10-CM | POA: Diagnosis not present

## 2022-01-11 DIAGNOSIS — L570 Actinic keratosis: Secondary | ICD-10-CM | POA: Diagnosis not present

## 2022-01-12 DIAGNOSIS — H353132 Nonexudative age-related macular degeneration, bilateral, intermediate dry stage: Secondary | ICD-10-CM | POA: Diagnosis not present

## 2022-01-14 DIAGNOSIS — H40023 Open angle with borderline findings, high risk, bilateral: Secondary | ICD-10-CM | POA: Diagnosis not present

## 2022-01-16 DIAGNOSIS — Z5189 Encounter for other specified aftercare: Secondary | ICD-10-CM | POA: Diagnosis not present

## 2022-01-16 DIAGNOSIS — C44729 Squamous cell carcinoma of skin of left lower limb, including hip: Secondary | ICD-10-CM | POA: Diagnosis not present

## 2022-01-16 DIAGNOSIS — Z8589 Personal history of malignant neoplasm of other organs and systems: Secondary | ICD-10-CM | POA: Diagnosis not present

## 2022-01-22 ENCOUNTER — Ambulatory Visit
Admission: RE | Admit: 2022-01-22 | Discharge: 2022-01-22 | Disposition: A | Discharge status: Home / Self Care | Payer: Medicare PPO | Source: Ambulatory Visit | Attending: Internal Medicine | Admitting: Internal Medicine

## 2022-01-22 DIAGNOSIS — Z1231 Encounter for screening mammogram for malignant neoplasm of breast: Secondary | ICD-10-CM | POA: Diagnosis not present

## 2022-01-23 DIAGNOSIS — H40003 Preglaucoma, unspecified, bilateral: Secondary | ICD-10-CM | POA: Diagnosis not present

## 2022-01-23 DIAGNOSIS — Z961 Presence of intraocular lens: Secondary | ICD-10-CM | POA: Diagnosis not present

## 2022-01-23 DIAGNOSIS — H35713 Central serous chorioretinopathy, bilateral: Secondary | ICD-10-CM | POA: Diagnosis not present

## 2022-01-23 DIAGNOSIS — H353132 Nonexudative age-related macular degeneration, bilateral, intermediate dry stage: Secondary | ICD-10-CM | POA: Diagnosis not present

## 2022-02-11 DIAGNOSIS — H353132 Nonexudative age-related macular degeneration, bilateral, intermediate dry stage: Secondary | ICD-10-CM | POA: Diagnosis not present

## 2022-02-18 DIAGNOSIS — Z5189 Encounter for other specified aftercare: Secondary | ICD-10-CM | POA: Diagnosis not present

## 2022-04-08 NOTE — Progress Notes (Signed)
Pt has taken BP medication this morning. This nurse recommended pt to keep record of BP daily and bring to next apt.

## 2022-04-19 ENCOUNTER — Encounter: Payer: Self-pay | Admitting: *Deleted

## 2022-04-23 NOTE — Progress Notes (Addendum)
Unable to contact pt by phone X 3 - after 04/08/22 screening event. [facility] documentation indicates pt has PC Rochele Raring at [facility], who has recently ordered mmg and dexa but unable to verify appt within the past 12 rolling months. Pt given screening b/p results at the time of the event and "nurse recommended pt to keep record of BP daily and bring to next appt" as of the 04/08/22 encounter. Pt called to confirm PCP is Dr. Louis Matte as documented and that she has next PCP appt in March, 2024. She has been monitoring her b/p "which has improved considerably." Pt added she was "grateful for the follow up and making sure she was being taken care of." Letter not sent as pt no longer needed additional resources - no additional health equity team support indicated as this time.

## 2022-05-10 ENCOUNTER — Ambulatory Visit
Admission: RE | Admit: 2022-05-10 | Discharge: 2022-05-10 | Disposition: A | Discharge status: Home / Self Care | Payer: Medicare PPO | Source: Ambulatory Visit | Attending: Internal Medicine | Admitting: Internal Medicine

## 2022-05-10 DIAGNOSIS — Z780 Asymptomatic menopausal state: Secondary | ICD-10-CM | POA: Diagnosis not present

## 2022-05-10 DIAGNOSIS — M8580 Other specified disorders of bone density and structure, unspecified site: Secondary | ICD-10-CM

## 2022-05-10 DIAGNOSIS — M85852 Other specified disorders of bone density and structure, left thigh: Secondary | ICD-10-CM | POA: Diagnosis not present

## 2022-05-12 DIAGNOSIS — H353132 Nonexudative age-related macular degeneration, bilateral, intermediate dry stage: Secondary | ICD-10-CM | POA: Diagnosis not present

## 2022-05-21 DIAGNOSIS — M8580 Other specified disorders of bone density and structure, unspecified site: Secondary | ICD-10-CM | POA: Diagnosis not present

## 2022-05-21 DIAGNOSIS — M810 Age-related osteoporosis without current pathological fracture: Secondary | ICD-10-CM | POA: Diagnosis not present

## 2022-05-28 DIAGNOSIS — K219 Gastro-esophageal reflux disease without esophagitis: Secondary | ICD-10-CM | POA: Diagnosis not present

## 2022-05-28 DIAGNOSIS — M8580 Other specified disorders of bone density and structure, unspecified site: Secondary | ICD-10-CM | POA: Diagnosis not present

## 2022-05-28 DIAGNOSIS — I10 Essential (primary) hypertension: Secondary | ICD-10-CM | POA: Diagnosis not present

## 2022-06-11 DIAGNOSIS — H353132 Nonexudative age-related macular degeneration, bilateral, intermediate dry stage: Secondary | ICD-10-CM | POA: Diagnosis not present

## 2022-07-02 DIAGNOSIS — L821 Other seborrheic keratosis: Secondary | ICD-10-CM | POA: Diagnosis not present

## 2022-07-02 DIAGNOSIS — D2239 Melanocytic nevi of other parts of face: Secondary | ICD-10-CM | POA: Diagnosis not present

## 2022-07-02 DIAGNOSIS — L570 Actinic keratosis: Secondary | ICD-10-CM | POA: Diagnosis not present

## 2022-07-11 DIAGNOSIS — H353132 Nonexudative age-related macular degeneration, bilateral, intermediate dry stage: Secondary | ICD-10-CM | POA: Diagnosis not present

## 2022-07-15 DIAGNOSIS — Z961 Presence of intraocular lens: Secondary | ICD-10-CM | POA: Diagnosis not present

## 2022-07-15 DIAGNOSIS — H401121 Primary open-angle glaucoma, left eye, mild stage: Secondary | ICD-10-CM | POA: Diagnosis not present

## 2022-08-08 DIAGNOSIS — H401121 Primary open-angle glaucoma, left eye, mild stage: Secondary | ICD-10-CM | POA: Diagnosis not present

## 2022-08-10 DIAGNOSIS — H353132 Nonexudative age-related macular degeneration, bilateral, intermediate dry stage: Secondary | ICD-10-CM | POA: Diagnosis not present

## 2022-08-13 DIAGNOSIS — H401132 Primary open-angle glaucoma, bilateral, moderate stage: Secondary | ICD-10-CM | POA: Diagnosis not present

## 2022-08-13 DIAGNOSIS — Z961 Presence of intraocular lens: Secondary | ICD-10-CM | POA: Diagnosis not present

## 2022-08-13 DIAGNOSIS — H43813 Vitreous degeneration, bilateral: Secondary | ICD-10-CM | POA: Diagnosis not present

## 2022-08-13 DIAGNOSIS — H353132 Nonexudative age-related macular degeneration, bilateral, intermediate dry stage: Secondary | ICD-10-CM | POA: Diagnosis not present

## 2022-08-13 DIAGNOSIS — H35033 Hypertensive retinopathy, bilateral: Secondary | ICD-10-CM | POA: Diagnosis not present

## 2022-09-09 DIAGNOSIS — H353132 Nonexudative age-related macular degeneration, bilateral, intermediate dry stage: Secondary | ICD-10-CM | POA: Diagnosis not present

## 2022-09-19 DIAGNOSIS — H52203 Unspecified astigmatism, bilateral: Secondary | ICD-10-CM | POA: Diagnosis not present

## 2022-09-19 DIAGNOSIS — H401121 Primary open-angle glaucoma, left eye, mild stage: Secondary | ICD-10-CM | POA: Diagnosis not present

## 2022-10-02 DIAGNOSIS — D485 Neoplasm of uncertain behavior of skin: Secondary | ICD-10-CM | POA: Diagnosis not present

## 2022-10-02 DIAGNOSIS — Z85828 Personal history of other malignant neoplasm of skin: Secondary | ICD-10-CM | POA: Diagnosis not present

## 2022-10-02 DIAGNOSIS — L578 Other skin changes due to chronic exposure to nonionizing radiation: Secondary | ICD-10-CM | POA: Diagnosis not present

## 2022-10-02 DIAGNOSIS — D225 Melanocytic nevi of trunk: Secondary | ICD-10-CM | POA: Diagnosis not present

## 2022-10-02 DIAGNOSIS — L570 Actinic keratosis: Secondary | ICD-10-CM | POA: Diagnosis not present

## 2022-10-02 DIAGNOSIS — C44622 Squamous cell carcinoma of skin of right upper limb, including shoulder: Secondary | ICD-10-CM | POA: Diagnosis not present

## 2022-10-02 DIAGNOSIS — L821 Other seborrheic keratosis: Secondary | ICD-10-CM | POA: Diagnosis not present

## 2022-10-02 DIAGNOSIS — L814 Other melanin hyperpigmentation: Secondary | ICD-10-CM | POA: Diagnosis not present

## 2022-10-09 DIAGNOSIS — H353132 Nonexudative age-related macular degeneration, bilateral, intermediate dry stage: Secondary | ICD-10-CM | POA: Diagnosis not present

## 2022-11-08 DIAGNOSIS — H353132 Nonexudative age-related macular degeneration, bilateral, intermediate dry stage: Secondary | ICD-10-CM | POA: Diagnosis not present

## 2022-11-20 DIAGNOSIS — D0461 Carcinoma in situ of skin of right upper limb, including shoulder: Secondary | ICD-10-CM | POA: Diagnosis not present

## 2022-11-20 DIAGNOSIS — C44622 Squamous cell carcinoma of skin of right upper limb, including shoulder: Secondary | ICD-10-CM | POA: Diagnosis not present

## 2022-12-02 DIAGNOSIS — I10 Essential (primary) hypertension: Secondary | ICD-10-CM | POA: Diagnosis not present

## 2022-12-02 DIAGNOSIS — Z23 Encounter for immunization: Secondary | ICD-10-CM | POA: Diagnosis not present

## 2022-12-02 DIAGNOSIS — K219 Gastro-esophageal reflux disease without esophagitis: Secondary | ICD-10-CM | POA: Diagnosis not present

## 2022-12-02 DIAGNOSIS — Z0000 Encounter for general adult medical examination without abnormal findings: Secondary | ICD-10-CM | POA: Diagnosis not present

## 2022-12-02 DIAGNOSIS — Z136 Encounter for screening for cardiovascular disorders: Secondary | ICD-10-CM | POA: Diagnosis not present

## 2022-12-02 DIAGNOSIS — E559 Vitamin D deficiency, unspecified: Secondary | ICD-10-CM | POA: Diagnosis not present

## 2022-12-02 DIAGNOSIS — M79672 Pain in left foot: Secondary | ICD-10-CM | POA: Diagnosis not present

## 2022-12-02 DIAGNOSIS — Z1331 Encounter for screening for depression: Secondary | ICD-10-CM | POA: Diagnosis not present

## 2022-12-02 DIAGNOSIS — Z79899 Other long term (current) drug therapy: Secondary | ICD-10-CM | POA: Diagnosis not present

## 2022-12-02 DIAGNOSIS — M8580 Other specified disorders of bone density and structure, unspecified site: Secondary | ICD-10-CM | POA: Diagnosis not present

## 2022-12-08 DIAGNOSIS — H353132 Nonexudative age-related macular degeneration, bilateral, intermediate dry stage: Secondary | ICD-10-CM | POA: Diagnosis not present

## 2022-12-12 ENCOUNTER — Other Ambulatory Visit: Payer: Self-pay | Admitting: Internal Medicine

## 2022-12-12 DIAGNOSIS — Z1231 Encounter for screening mammogram for malignant neoplasm of breast: Secondary | ICD-10-CM

## 2023-01-07 DIAGNOSIS — H353132 Nonexudative age-related macular degeneration, bilateral, intermediate dry stage: Secondary | ICD-10-CM | POA: Diagnosis not present

## 2023-01-10 DIAGNOSIS — L578 Other skin changes due to chronic exposure to nonionizing radiation: Secondary | ICD-10-CM | POA: Diagnosis not present

## 2023-01-10 DIAGNOSIS — C44722 Squamous cell carcinoma of skin of right lower limb, including hip: Secondary | ICD-10-CM | POA: Diagnosis not present

## 2023-01-10 DIAGNOSIS — Z189 Retained foreign body fragments, unspecified material: Secondary | ICD-10-CM | POA: Diagnosis not present

## 2023-01-10 DIAGNOSIS — T8189XA Other complications of procedures, not elsewhere classified, initial encounter: Secondary | ICD-10-CM | POA: Diagnosis not present

## 2023-01-10 DIAGNOSIS — L570 Actinic keratosis: Secondary | ICD-10-CM | POA: Diagnosis not present

## 2023-01-10 DIAGNOSIS — D485 Neoplasm of uncertain behavior of skin: Secondary | ICD-10-CM | POA: Diagnosis not present

## 2023-01-10 DIAGNOSIS — D2239 Melanocytic nevi of other parts of face: Secondary | ICD-10-CM | POA: Diagnosis not present

## 2023-01-18 IMAGING — MG MM DIGITAL SCREENING BILAT W/ TOMO AND CAD
8 series · 8 of 24 positions shown · non-contrast
Comparison: Previous exam(s).

CLINICAL DATA: Screening.

EXAM:
DIGITAL SCREENING BILATERAL MAMMOGRAM WITH TOMOSYNTHESIS AND CAD
TECHNIQUE: Bilateral screening digital craniocaudal and mediolateral oblique
mammograms were obtained. Bilateral screening digital breast
tomosynthesis was performed. The images were evaluated with
computer-aided detection.

[L MLO synth-2D]
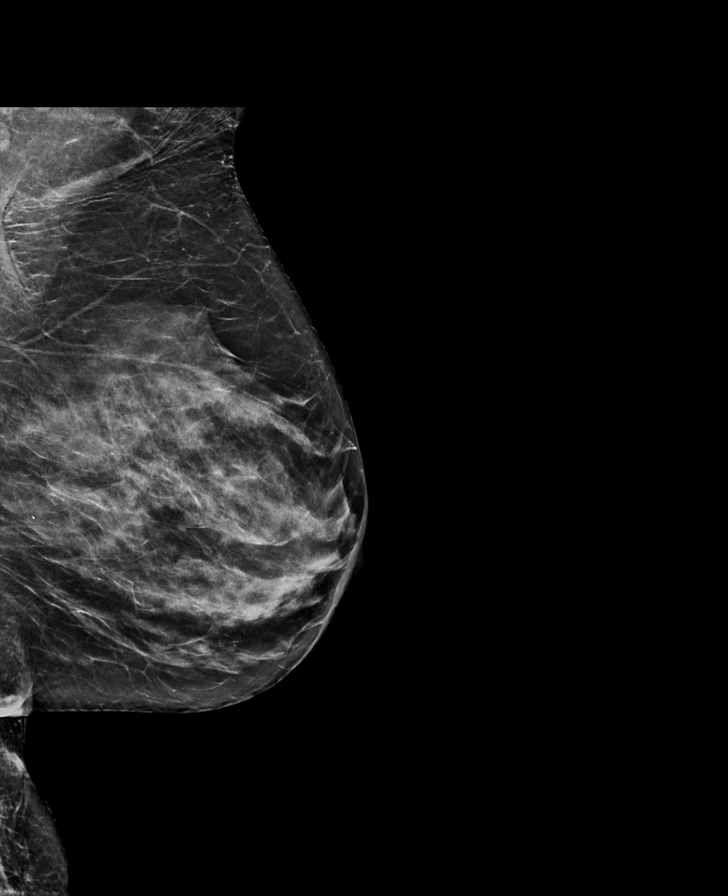

[R MLO synth-2D]
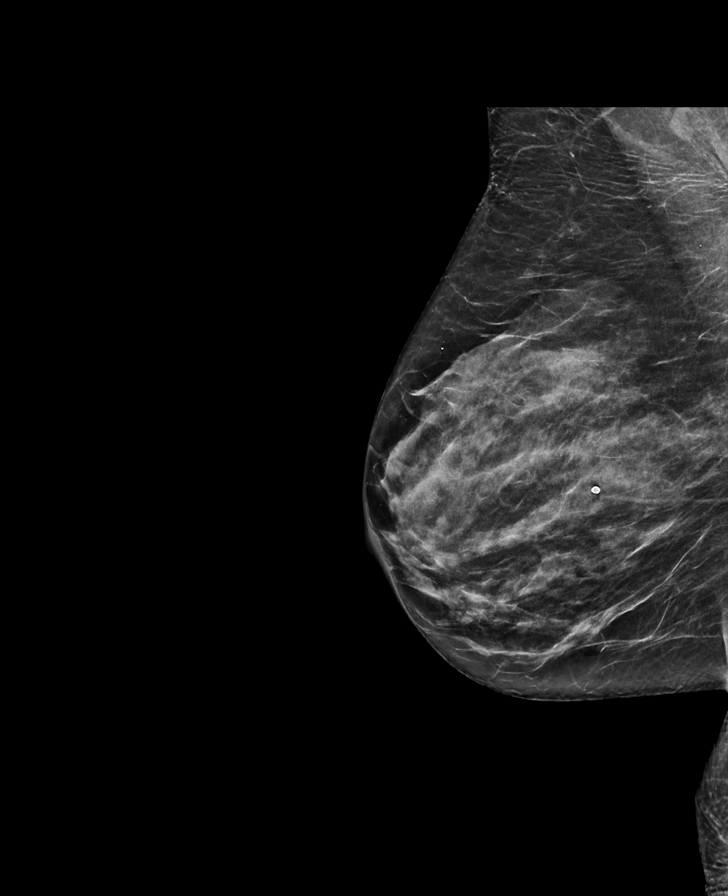

[R CC synth-2D]
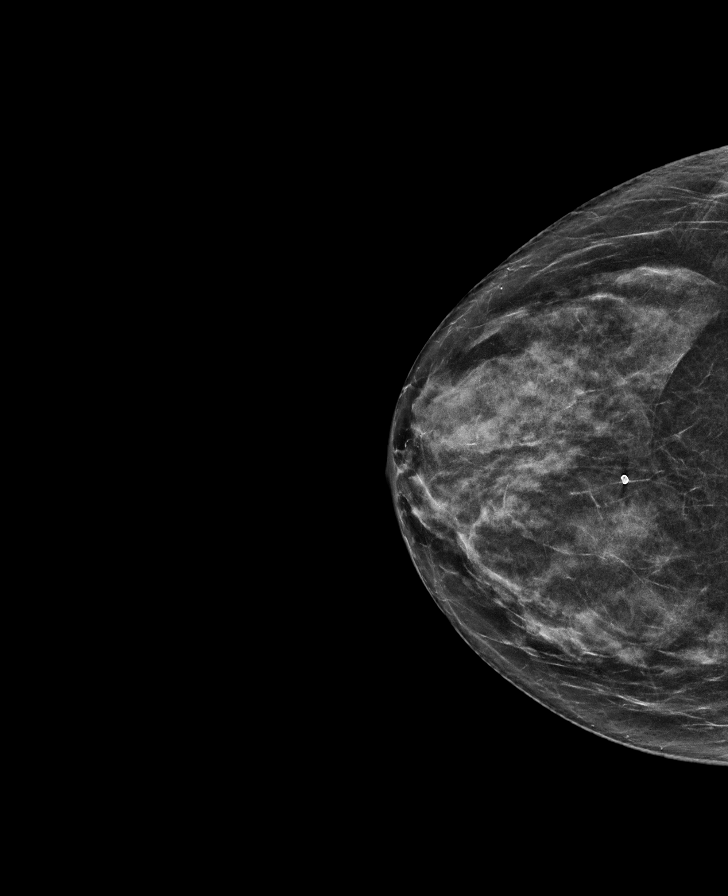

[L CC synth-2D]
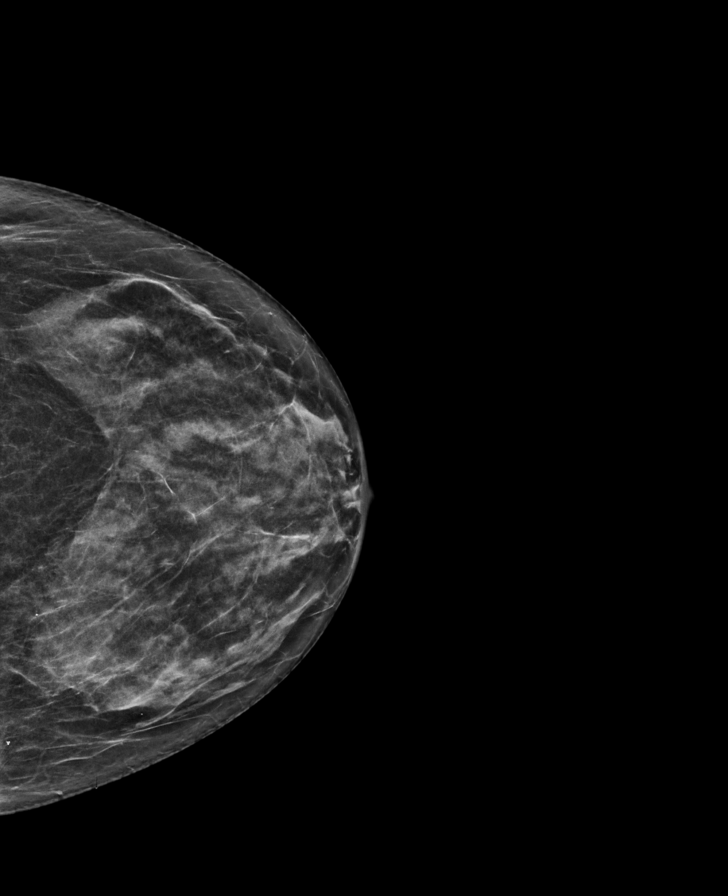

[L MLO tomo · tomo slice 33/64.0]
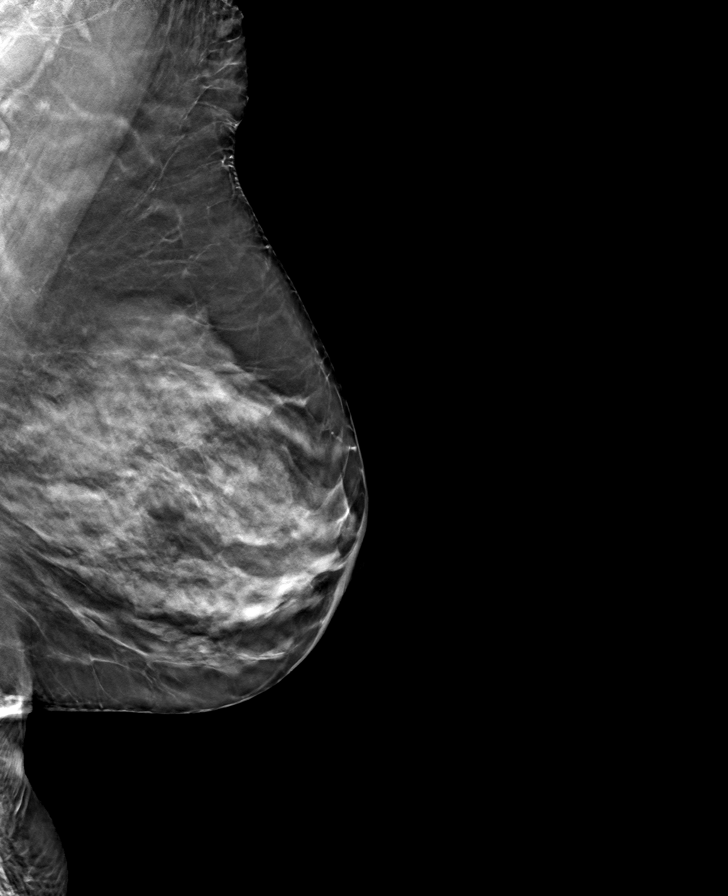

[L CC tomo · tomo slice 29/58.0]
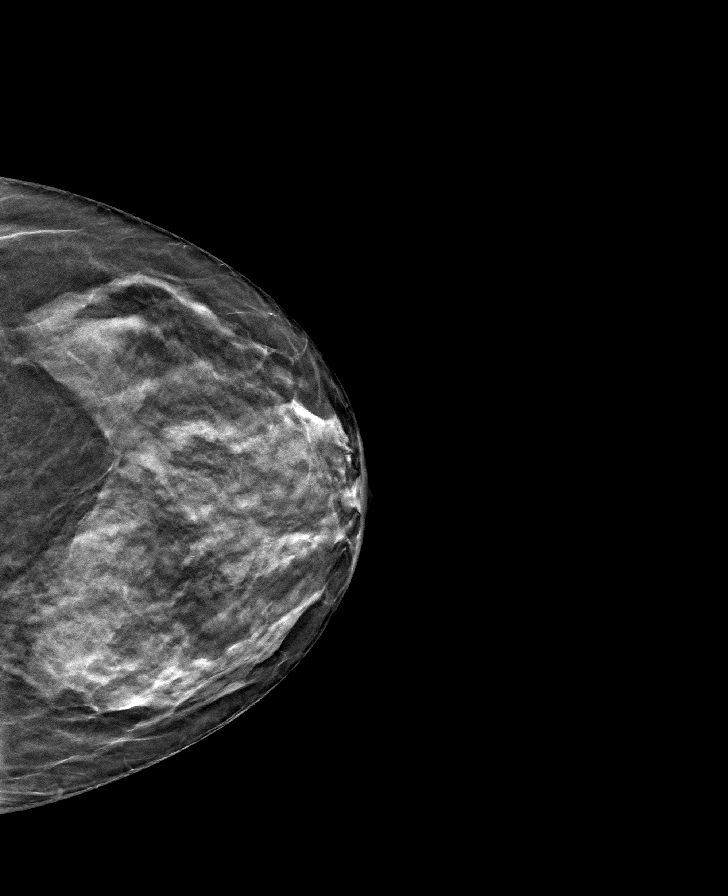

[R CC tomo · tomo slice 30/59.0]
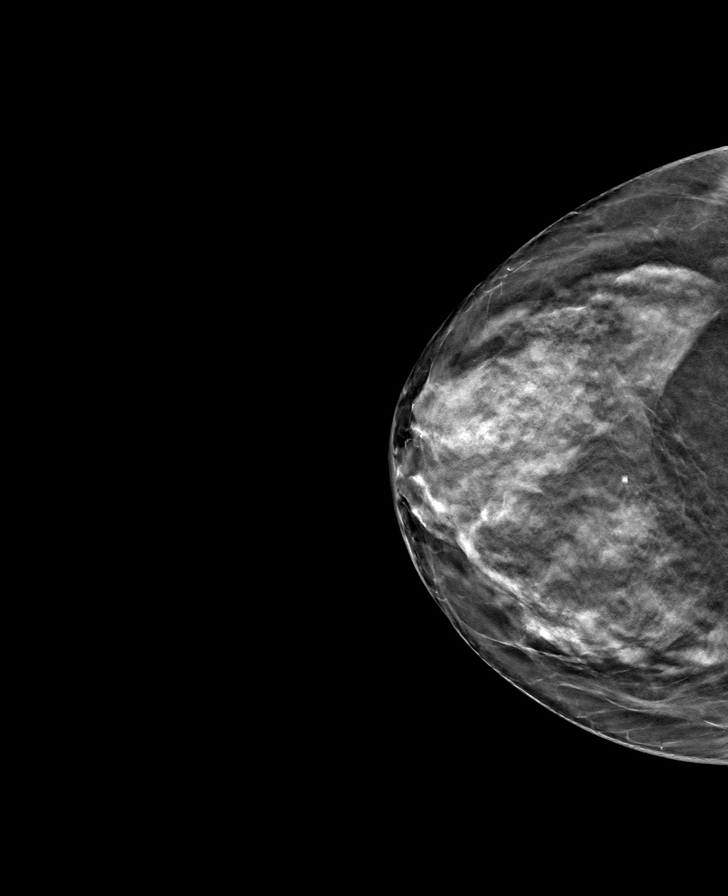

[R MLO tomo · tomo slice 31/61.0]
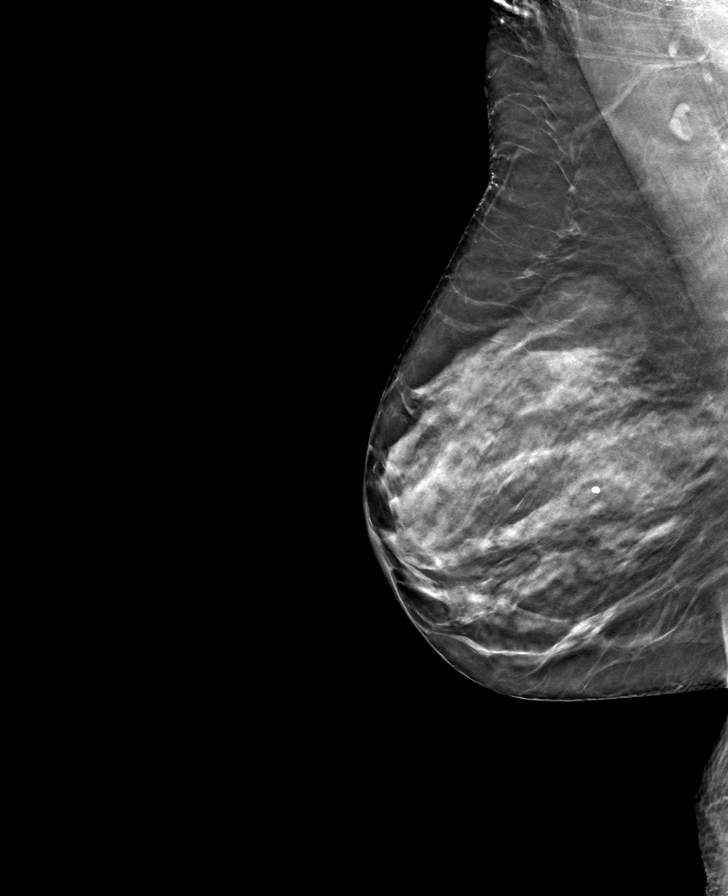

[8 of 24 positions shown; findings below may reference images not displayed]

ACR Breast Density Category c: The breast tissue is heterogeneously
dense, which may obscure small masses.
FINDINGS: There are no findings suspicious for malignancy.
IMPRESSION: No mammographic evidence of malignancy. A result letter of this
screening mammogram will be mailed directly to the patient.

RECOMMENDATION:
Screening mammogram in one year. (Code:[id number])

BI-RADS CATEGORY  1: Negative.

## 2023-02-03 ENCOUNTER — Ambulatory Visit
Admission: RE | Admit: 2023-02-03 | Discharge: 2023-02-03 | Disposition: A | Discharge status: Home / Self Care | Payer: Medicare PPO | Source: Ambulatory Visit | Attending: Internal Medicine | Admitting: Internal Medicine

## 2023-02-03 DIAGNOSIS — Z1231 Encounter for screening mammogram for malignant neoplasm of breast: Secondary | ICD-10-CM | POA: Diagnosis not present

## 2023-02-06 DIAGNOSIS — H353132 Nonexudative age-related macular degeneration, bilateral, intermediate dry stage: Secondary | ICD-10-CM | POA: Diagnosis not present

## 2023-02-11 DIAGNOSIS — H35033 Hypertensive retinopathy, bilateral: Secondary | ICD-10-CM | POA: Diagnosis not present

## 2023-02-11 DIAGNOSIS — H401132 Primary open-angle glaucoma, bilateral, moderate stage: Secondary | ICD-10-CM | POA: Diagnosis not present

## 2023-02-11 DIAGNOSIS — Z961 Presence of intraocular lens: Secondary | ICD-10-CM | POA: Diagnosis not present

## 2023-02-11 DIAGNOSIS — H43813 Vitreous degeneration, bilateral: Secondary | ICD-10-CM | POA: Diagnosis not present

## 2023-02-11 DIAGNOSIS — H353132 Nonexudative age-related macular degeneration, bilateral, intermediate dry stage: Secondary | ICD-10-CM | POA: Diagnosis not present

## 2023-02-19 DIAGNOSIS — H401121 Primary open-angle glaucoma, left eye, mild stage: Secondary | ICD-10-CM | POA: Diagnosis not present

## 2023-02-24 DIAGNOSIS — C44722 Squamous cell carcinoma of skin of right lower limb, including hip: Secondary | ICD-10-CM | POA: Diagnosis not present

## 2023-03-08 DIAGNOSIS — H353132 Nonexudative age-related macular degeneration, bilateral, intermediate dry stage: Secondary | ICD-10-CM | POA: Diagnosis not present

## 2023-03-25 DIAGNOSIS — L929 Granulomatous disorder of the skin and subcutaneous tissue, unspecified: Secondary | ICD-10-CM | POA: Diagnosis not present

## 2023-04-07 DIAGNOSIS — H353132 Nonexudative age-related macular degeneration, bilateral, intermediate dry stage: Secondary | ICD-10-CM | POA: Diagnosis not present

## 2023-04-17 DIAGNOSIS — L570 Actinic keratosis: Secondary | ICD-10-CM | POA: Diagnosis not present

## 2023-04-17 DIAGNOSIS — D225 Melanocytic nevi of trunk: Secondary | ICD-10-CM | POA: Diagnosis not present

## 2023-04-17 DIAGNOSIS — L578 Other skin changes due to chronic exposure to nonionizing radiation: Secondary | ICD-10-CM | POA: Diagnosis not present

## 2023-04-17 DIAGNOSIS — L814 Other melanin hyperpigmentation: Secondary | ICD-10-CM | POA: Diagnosis not present

## 2023-04-17 DIAGNOSIS — Z85828 Personal history of other malignant neoplasm of skin: Secondary | ICD-10-CM | POA: Diagnosis not present

## 2023-04-17 DIAGNOSIS — L249 Irritant contact dermatitis, unspecified cause: Secondary | ICD-10-CM | POA: Diagnosis not present

## 2023-04-17 DIAGNOSIS — L821 Other seborrheic keratosis: Secondary | ICD-10-CM | POA: Diagnosis not present

## 2023-05-07 DIAGNOSIS — H353132 Nonexudative age-related macular degeneration, bilateral, intermediate dry stage: Secondary | ICD-10-CM | POA: Diagnosis not present

## 2023-05-07 DIAGNOSIS — H401121 Primary open-angle glaucoma, left eye, mild stage: Secondary | ICD-10-CM | POA: Diagnosis not present

## 2023-05-22 DIAGNOSIS — I10 Essential (primary) hypertension: Secondary | ICD-10-CM | POA: Diagnosis not present

## 2023-05-22 DIAGNOSIS — J040 Acute laryngitis: Secondary | ICD-10-CM | POA: Diagnosis not present

## 2023-05-31 DIAGNOSIS — J358 Other chronic diseases of tonsils and adenoids: Secondary | ICD-10-CM | POA: Diagnosis not present

## 2023-05-31 DIAGNOSIS — J039 Acute tonsillitis, unspecified: Secondary | ICD-10-CM | POA: Diagnosis not present

## 2023-05-31 DIAGNOSIS — J029 Acute pharyngitis, unspecified: Secondary | ICD-10-CM | POA: Diagnosis not present

## 2023-06-02 DIAGNOSIS — H353132 Nonexudative age-related macular degeneration, bilateral, intermediate dry stage: Secondary | ICD-10-CM | POA: Diagnosis not present

## 2023-06-02 DIAGNOSIS — H35033 Hypertensive retinopathy, bilateral: Secondary | ICD-10-CM | POA: Diagnosis not present

## 2023-06-02 DIAGNOSIS — H401132 Primary open-angle glaucoma, bilateral, moderate stage: Secondary | ICD-10-CM | POA: Diagnosis not present

## 2023-06-02 DIAGNOSIS — H43813 Vitreous degeneration, bilateral: Secondary | ICD-10-CM | POA: Diagnosis not present

## 2023-06-02 DIAGNOSIS — Z961 Presence of intraocular lens: Secondary | ICD-10-CM | POA: Diagnosis not present

## 2023-06-06 DIAGNOSIS — H353132 Nonexudative age-related macular degeneration, bilateral, intermediate dry stage: Secondary | ICD-10-CM | POA: Diagnosis not present

## 2023-06-25 DIAGNOSIS — Z800 Family history of malignant neoplasm of digestive organs: Secondary | ICD-10-CM | POA: Diagnosis not present

## 2023-06-25 DIAGNOSIS — D123 Benign neoplasm of transverse colon: Secondary | ICD-10-CM | POA: Diagnosis not present

## 2023-06-25 DIAGNOSIS — D128 Benign neoplasm of rectum: Secondary | ICD-10-CM | POA: Diagnosis not present

## 2023-06-25 DIAGNOSIS — Z860101 Personal history of adenomatous and serrated colon polyps: Secondary | ICD-10-CM | POA: Diagnosis not present

## 2023-06-25 DIAGNOSIS — Z09 Encounter for follow-up examination after completed treatment for conditions other than malignant neoplasm: Secondary | ICD-10-CM | POA: Diagnosis not present

## 2023-07-06 DIAGNOSIS — H353132 Nonexudative age-related macular degeneration, bilateral, intermediate dry stage: Secondary | ICD-10-CM | POA: Diagnosis not present

## 2023-07-15 DIAGNOSIS — L570 Actinic keratosis: Secondary | ICD-10-CM | POA: Diagnosis not present

## 2023-08-05 DIAGNOSIS — H353132 Nonexudative age-related macular degeneration, bilateral, intermediate dry stage: Secondary | ICD-10-CM | POA: Diagnosis not present

## 2023-08-12 DIAGNOSIS — H401132 Primary open-angle glaucoma, bilateral, moderate stage: Secondary | ICD-10-CM | POA: Diagnosis not present

## 2023-08-12 DIAGNOSIS — H35033 Hypertensive retinopathy, bilateral: Secondary | ICD-10-CM | POA: Diagnosis not present

## 2023-08-12 DIAGNOSIS — H43813 Vitreous degeneration, bilateral: Secondary | ICD-10-CM | POA: Diagnosis not present

## 2023-08-12 DIAGNOSIS — Z961 Presence of intraocular lens: Secondary | ICD-10-CM | POA: Diagnosis not present

## 2023-08-12 DIAGNOSIS — H353132 Nonexudative age-related macular degeneration, bilateral, intermediate dry stage: Secondary | ICD-10-CM | POA: Diagnosis not present

## 2023-09-04 DIAGNOSIS — H353132 Nonexudative age-related macular degeneration, bilateral, intermediate dry stage: Secondary | ICD-10-CM | POA: Diagnosis not present

## 2023-09-10 DIAGNOSIS — H353131 Nonexudative age-related macular degeneration, bilateral, early dry stage: Secondary | ICD-10-CM | POA: Diagnosis not present

## 2023-09-10 DIAGNOSIS — Z961 Presence of intraocular lens: Secondary | ICD-10-CM | POA: Diagnosis not present

## 2023-09-10 DIAGNOSIS — H401131 Primary open-angle glaucoma, bilateral, mild stage: Secondary | ICD-10-CM | POA: Diagnosis not present

## 2023-10-04 DIAGNOSIS — H353132 Nonexudative age-related macular degeneration, bilateral, intermediate dry stage: Secondary | ICD-10-CM | POA: Diagnosis not present

## 2023-10-15 DIAGNOSIS — L578 Other skin changes due to chronic exposure to nonionizing radiation: Secondary | ICD-10-CM | POA: Diagnosis not present

## 2023-10-15 DIAGNOSIS — Z85828 Personal history of other malignant neoplasm of skin: Secondary | ICD-10-CM | POA: Diagnosis not present

## 2023-10-15 DIAGNOSIS — D225 Melanocytic nevi of trunk: Secondary | ICD-10-CM | POA: Diagnosis not present

## 2023-10-15 DIAGNOSIS — L859 Epidermal thickening, unspecified: Secondary | ICD-10-CM | POA: Diagnosis not present

## 2023-10-15 DIAGNOSIS — L821 Other seborrheic keratosis: Secondary | ICD-10-CM | POA: Diagnosis not present

## 2023-10-15 DIAGNOSIS — L570 Actinic keratosis: Secondary | ICD-10-CM | POA: Diagnosis not present

## 2023-10-15 DIAGNOSIS — L814 Other melanin hyperpigmentation: Secondary | ICD-10-CM | POA: Diagnosis not present

## 2023-10-15 DIAGNOSIS — D485 Neoplasm of uncertain behavior of skin: Secondary | ICD-10-CM | POA: Diagnosis not present

## 2023-11-03 DIAGNOSIS — H353132 Nonexudative age-related macular degeneration, bilateral, intermediate dry stage: Secondary | ICD-10-CM | POA: Diagnosis not present

## 2023-11-12 DIAGNOSIS — H353221 Exudative age-related macular degeneration, left eye, with active choroidal neovascularization: Secondary | ICD-10-CM | POA: Diagnosis not present

## 2023-11-12 DIAGNOSIS — H43813 Vitreous degeneration, bilateral: Secondary | ICD-10-CM | POA: Diagnosis not present

## 2023-11-12 DIAGNOSIS — H35033 Hypertensive retinopathy, bilateral: Secondary | ICD-10-CM | POA: Diagnosis not present

## 2023-11-12 DIAGNOSIS — H401130 Primary open-angle glaucoma, bilateral, stage unspecified: Secondary | ICD-10-CM | POA: Diagnosis not present

## 2023-11-12 DIAGNOSIS — H353112 Nonexudative age-related macular degeneration, right eye, intermediate dry stage: Secondary | ICD-10-CM | POA: Diagnosis not present

## 2023-11-12 DIAGNOSIS — Z961 Presence of intraocular lens: Secondary | ICD-10-CM | POA: Diagnosis not present

## 2023-11-13 DIAGNOSIS — H353221 Exudative age-related macular degeneration, left eye, with active choroidal neovascularization: Secondary | ICD-10-CM | POA: Diagnosis not present

## 2023-12-03 DIAGNOSIS — H353132 Nonexudative age-related macular degeneration, bilateral, intermediate dry stage: Secondary | ICD-10-CM | POA: Diagnosis not present

## 2023-12-05 DIAGNOSIS — E559 Vitamin D deficiency, unspecified: Secondary | ICD-10-CM | POA: Diagnosis not present

## 2023-12-05 DIAGNOSIS — Z79899 Other long term (current) drug therapy: Secondary | ICD-10-CM | POA: Diagnosis not present

## 2023-12-05 DIAGNOSIS — I10 Essential (primary) hypertension: Secondary | ICD-10-CM | POA: Diagnosis not present

## 2023-12-05 DIAGNOSIS — E785 Hyperlipidemia, unspecified: Secondary | ICD-10-CM | POA: Diagnosis not present

## 2023-12-05 DIAGNOSIS — M8580 Other specified disorders of bone density and structure, unspecified site: Secondary | ICD-10-CM | POA: Diagnosis not present

## 2023-12-05 DIAGNOSIS — Z0000 Encounter for general adult medical examination without abnormal findings: Secondary | ICD-10-CM | POA: Diagnosis not present

## 2023-12-05 DIAGNOSIS — Z23 Encounter for immunization: Secondary | ICD-10-CM | POA: Diagnosis not present

## 2023-12-11 DIAGNOSIS — H43813 Vitreous degeneration, bilateral: Secondary | ICD-10-CM | POA: Diagnosis not present

## 2023-12-11 DIAGNOSIS — H353221 Exudative age-related macular degeneration, left eye, with active choroidal neovascularization: Secondary | ICD-10-CM | POA: Diagnosis not present

## 2023-12-11 DIAGNOSIS — H35033 Hypertensive retinopathy, bilateral: Secondary | ICD-10-CM | POA: Diagnosis not present

## 2023-12-11 DIAGNOSIS — Z961 Presence of intraocular lens: Secondary | ICD-10-CM | POA: Diagnosis not present

## 2023-12-11 DIAGNOSIS — H353112 Nonexudative age-related macular degeneration, right eye, intermediate dry stage: Secondary | ICD-10-CM | POA: Diagnosis not present

## 2023-12-11 DIAGNOSIS — H401130 Primary open-angle glaucoma, bilateral, stage unspecified: Secondary | ICD-10-CM | POA: Diagnosis not present

## 2023-12-31 ENCOUNTER — Other Ambulatory Visit: Payer: Self-pay | Admitting: Internal Medicine

## 2023-12-31 DIAGNOSIS — Z1231 Encounter for screening mammogram for malignant neoplasm of breast: Secondary | ICD-10-CM

## 2024-01-02 DIAGNOSIS — H353132 Nonexudative age-related macular degeneration, bilateral, intermediate dry stage: Secondary | ICD-10-CM | POA: Diagnosis not present

## 2024-01-08 DIAGNOSIS — H353221 Exudative age-related macular degeneration, left eye, with active choroidal neovascularization: Secondary | ICD-10-CM | POA: Diagnosis not present

## 2024-01-08 DIAGNOSIS — Z961 Presence of intraocular lens: Secondary | ICD-10-CM | POA: Diagnosis not present

## 2024-01-08 DIAGNOSIS — H401130 Primary open-angle glaucoma, bilateral, stage unspecified: Secondary | ICD-10-CM | POA: Diagnosis not present

## 2024-01-08 DIAGNOSIS — H43813 Vitreous degeneration, bilateral: Secondary | ICD-10-CM | POA: Diagnosis not present

## 2024-01-08 DIAGNOSIS — H353112 Nonexudative age-related macular degeneration, right eye, intermediate dry stage: Secondary | ICD-10-CM | POA: Diagnosis not present

## 2024-01-08 DIAGNOSIS — H35033 Hypertensive retinopathy, bilateral: Secondary | ICD-10-CM | POA: Diagnosis not present

## 2024-02-01 DIAGNOSIS — H353132 Nonexudative age-related macular degeneration, bilateral, intermediate dry stage: Secondary | ICD-10-CM | POA: Diagnosis not present

## 2024-02-05 ENCOUNTER — Ambulatory Visit
Admission: RE | Admit: 2024-02-05 | Discharge: 2024-02-05 | Disposition: A | Source: Ambulatory Visit | Attending: Internal Medicine | Admitting: Internal Medicine

## 2024-02-05 DIAGNOSIS — Z1231 Encounter for screening mammogram for malignant neoplasm of breast: Secondary | ICD-10-CM
